# Patient Record
Sex: Female | Born: 1935 | Race: White | Hispanic: No | Marital: Married
Health system: Southern US, Community
[De-identification: ages and names within clinical notes are randomized; demographics above are authoritative.]

---

## 2004-09-26 ENCOUNTER — Ambulatory Visit: Payer: Self-pay | Admitting: Unknown Physician Specialty

## 2004-09-26 ENCOUNTER — Other Ambulatory Visit: Payer: Self-pay

## 2004-09-29 ENCOUNTER — Ambulatory Visit: Payer: Self-pay | Admitting: Unknown Physician Specialty

## 2005-10-11 ENCOUNTER — Ambulatory Visit: Payer: Self-pay | Admitting: Ophthalmology

## 2005-11-22 ENCOUNTER — Ambulatory Visit: Payer: Self-pay | Admitting: Ophthalmology

## 2006-06-19 ENCOUNTER — Ambulatory Visit: Payer: Self-pay | Admitting: Gastroenterology

## 2006-08-08 ENCOUNTER — Ambulatory Visit: Payer: Self-pay | Admitting: Internal Medicine

## 2006-08-29 ENCOUNTER — Ambulatory Visit: Payer: Self-pay | Admitting: Urology

## 2007-02-12 ENCOUNTER — Ambulatory Visit: Payer: Self-pay | Admitting: Urology

## 2007-10-15 ENCOUNTER — Ambulatory Visit: Payer: Self-pay | Admitting: Urology

## 2008-08-06 ENCOUNTER — Ambulatory Visit: Payer: Self-pay | Admitting: Orthopedic Surgery

## 2008-10-13 ENCOUNTER — Ambulatory Visit: Payer: Self-pay | Admitting: Urology

## 2009-10-04 ENCOUNTER — Ambulatory Visit: Payer: Self-pay | Admitting: General Practice

## 2009-10-18 ENCOUNTER — Inpatient Hospital Stay: Payer: Self-pay | Admitting: General Practice

## 2010-02-01 ENCOUNTER — Ambulatory Visit: Payer: Self-pay

## 2010-02-17 ENCOUNTER — Ambulatory Visit: Payer: Self-pay | Admitting: Neurology

## 2010-03-22 ENCOUNTER — Encounter: Payer: Self-pay | Admitting: Neurology

## 2010-04-07 ENCOUNTER — Encounter: Payer: Self-pay | Admitting: Neurology

## 2011-11-26 ENCOUNTER — Ambulatory Visit: Payer: Self-pay | Admitting: Internal Medicine

## 2012-02-28 ENCOUNTER — Emergency Department: Payer: Self-pay | Admitting: Emergency Medicine

## 2012-08-06 ENCOUNTER — Ambulatory Visit: Payer: Self-pay | Admitting: Internal Medicine

## 2012-08-30 ENCOUNTER — Inpatient Hospital Stay: Payer: Self-pay | Admitting: Internal Medicine

## 2012-08-30 LAB — COMPREHENSIVE METABOLIC PANEL
Alkaline Phosphatase: 89 U/L (ref 50–136)
Anion Gap: 4 — ABNORMAL LOW (ref 7–16)
Bilirubin,Total: 0.5 mg/dL (ref 0.2–1.0)
Calcium, Total: 9.1 mg/dL (ref 8.5–10.1)
Creatinine: 0.38 mg/dL — ABNORMAL LOW (ref 0.60–1.30)
Glucose: 217 mg/dL — ABNORMAL HIGH (ref 65–99)
Osmolality: 279 (ref 275–301)
Potassium: 4.1 mmol/L (ref 3.5–5.1)
SGPT (ALT): 19 U/L (ref 12–78)
Sodium: 137 mmol/L (ref 136–145)

## 2012-08-30 LAB — CK TOTAL AND CKMB (NOT AT ARMC)
CK, Total: 29 U/L (ref 21–215)
CK, Total: 30 U/L (ref 21–215)
CK, Total: 27 U/L (ref 21–215)
CK-MB: 0.8 ng/mL (ref 0.5–3.6)
CK-MB: 1.8 ng/mL (ref 0.5–3.6)

## 2012-08-30 LAB — CBC
HCT: 39.7 % (ref 35.0–47.0)
MCH: 31.9 pg (ref 26.0–34.0)
MCHC: 33.5 g/dL (ref 32.0–36.0)
MCV: 95 fL (ref 80–100)
Platelet: 296 10*3/uL (ref 150–440)
RBC: 4.17 10*6/uL (ref 3.80–5.20)
RDW: 12.8 % (ref 11.5–14.5)

## 2012-08-30 LAB — TROPONIN I
Troponin-I: <0.02 ng/mL
Troponin-I: <0.02 ng/mL

## 2012-08-31 LAB — BASIC METABOLIC PANEL
BUN: 6 mg/dL — ABNORMAL LOW (ref 7–18)
Calcium, Total: 8.9 mg/dL (ref 8.5–10.1)
Chloride: 106 mmol/L (ref 98–107)
EGFR (African American): >60
EGFR (Non-African Amer.): >60
Glucose: 89 mg/dL (ref 65–99)
Osmolality: 282 (ref 275–301)
Potassium: 3.6 mmol/L (ref 3.5–5.1)
Sodium: 143 mmol/L (ref 136–145)

## 2012-08-31 LAB — CBC WITH DIFFERENTIAL/PLATELET
Basophil #: 0 10*3/uL (ref 0.0–0.1)
Eosinophil %: 0 %
HGB: 11.1 g/dL — ABNORMAL LOW (ref 12.0–16.0)
Lymphocyte %: 10.5 %
Monocyte #: 1.6 x10 3/mm — ABNORMAL HIGH (ref 0.2–0.9)
Monocyte %: 11.9 %
Neutrophil %: 77.4 %
Platelet: 237 10*3/uL (ref 150–440)
RDW: 12.8 % (ref 11.5–14.5)
WBC: 13.4 10*3/uL — ABNORMAL HIGH (ref 3.6–11.0)

## 2012-08-31 LAB — TSH: Thyroid Stimulating Horm: 0.783 u[IU]/mL

## 2012-09-02 LAB — CBC WITH DIFFERENTIAL/PLATELET
Basophil #: 0 10*3/uL (ref 0.0–0.1)
Eosinophil #: 0 10*3/uL (ref 0.0–0.7)
Eosinophil %: 0 %
HGB: 10.1 g/dL — ABNORMAL LOW (ref 12.0–16.0)
Lymphocyte #: 0.7 10*3/uL — ABNORMAL LOW (ref 1.0–3.6)
Lymphocyte %: 9.1 %
MCH: 32.2 pg (ref 26.0–34.0)
MCHC: 34 g/dL (ref 32.0–36.0)
MCV: 95 fL (ref 80–100)
Monocyte %: 8.8 %
Neutrophil #: 6.2 10*3/uL (ref 1.4–6.5)
Neutrophil %: 82 %
Platelet: 263 10*3/uL (ref 150–440)
RBC: 3.14 10*6/uL — ABNORMAL LOW (ref 3.80–5.20)
RDW: 12.5 % (ref 11.5–14.5)
WBC: 7.6 10*3/uL (ref 3.6–11.0)

## 2012-09-04 LAB — CULTURE, BLOOD (SINGLE)

## 2012-09-06 ENCOUNTER — Ambulatory Visit: Payer: Self-pay | Admitting: Internal Medicine

## 2012-11-06 DEATH — deceased

## 2014-05-29 NOTE — H&P (Signed)
PATIENT NAME:  Melanie Dawson, Melanie Dawson MR#:  [id number] DATE OF BIRTH:  Feb 26, 1934  DATE OF ADMISSION:  08/30/2012  PRIMARY CARE PHYSICIAN:  Dr. Sherrie MustacheFayegh Jadali.   REFERRING PHYSICIAN:  Dr. Dolores FrameSung.   CHIEF COMPLAINT:  Respiratory distress.   HISTORY OF PRESENT ILLNESS:  Melanie Dawson is Dawson 80 year old with amyotrophic lateral sclerosis, severe dysarthria, severe protein calorie malnutrition, is brought to the Emergency Department by EMS for severe respiratory distress.  Per husband, the patient has not been feeling well for the last 3 to 4 days.  This evening noted to have severe respiratory distress.  Concerning this, EMS was called.  The patient is found to have right lower lobe pneumonia.  The patient cannot provide any history secondary to severe dysphonia.  However, the patient's husband also is Dawson poor historian.  Per husband, the patient does not have any cough.  The patient denies any chest pain.  The patient is also found to have elevated white blood cell count.  The patient received vancomycin and Zosyn in the Emergency Department.  ABG showed pH of 7.17, pCO2 of 89, PaO2 of 78.  The patient is placed on BiPAP.  The patient's initial blood pressure was well within normal limits.  However, after placing the BiPAP, patient's blood pressure trended down to systolic blood pressure at 70s.  The patient was given 1 liter fluid bolus with significant improvement with the blood pressure to 110 to 120.  The patient continues to be tachycardic in the Emergency Department.   PAST MEDICAL HISTORY: 1.  Amyotrophic lateral sclerosis.  2.  Gastroesophageal reflux disease.  3.  History of MRSA on nasal swab.  4.  History of asthma.   PAST SURGICAL HISTORY: 1.  Right total knee replacement.  2.  Partial hysterectomy.  3.  Bladder surgery. 4.  Back surgery.   ALLERGIES:   1.  CODEINE CAUSED GI DISTRESS.   2.  CLARITHROMYCIN.  3.  NOVAHISTINE.  4.  KEFLEX.  5.  MINOCIN.  6.  . 7.  NAPROSYN.  ALL THESE  CAUSED GI DISTRESS.   HOME MEDICATIONS: 1.   60 mg once Dawson day.  2.  Premarin 3 mg once Dawson day.  3.  Oxycodone 5 mg 1 to 2 tablets every 4 hours as needed.  4.  Omeprazole 20 mg once Dawson day.  5.  Niacin 1 tablet once Dawson day.  6.  Lovenox 40 mg subcutaneous once Dawson day.  7.  Flovent 1 puff inhalation in the morning and at bedtime.  8.  Flonase two sprays to each nostril.  9.  Doxepin 75 mg once Dawson day.  10.  Claritin 10 mg daily.  11.  Multivitamin 1 tablet daily.  12.  Calcium with vitamin D 1 capsule 2 times Dawson day.  13.  Aspirin 81 mg daily. 14.  Albuterol inhaler once Dawson day.  15.  Actonel 35 mg once Dawson week.   SOCIAL HISTORY:  No history of smoking, drinking alcohol or using illicit drugs.  Married, lives with her husband.  The patient is mostly bedbound, however the patient's husband takes her in Dawson wheelchair.   REVIEW OF SYSTEMS:  Could not be obtained from the patient as the patient has difficulty speaking.   PHYSICAL EXAMINATION: GENERAL:  This is Dawson cachectic-looking female lying down in the bed on BiPAP not in distress.  VITAL SIGNS:  Temperature 97.5, pulse 127, blood pressure 116/64, respiratory rate of 40, oxygen saturation is 97% on BiPAP.  HEENT:  Head normocephalic, atraumatic.  Eyes, no sclerae icterus.  Conjunctivae normal.  Pupils equal and react to light.  Extraocular movements are intact.  Mucous membranes, mild dryness.  No pharyngeal erythema.  NECK:  Supple.  No lymphadenopathy.  No JVD.  No carotid bruit.  No thyromegaly.  CHEST:  Has no focal tenderness.  Poor air entry in some areas.  Could not appreciate if the patient has any crackles.  HEART:  S1 and S2, regular, tachycardia.  No pedal edema.  Pulses 2+.  ABDOMEN:  Bowel sounds plus.  Soft, nontender, nondistended.  NEUROLOGIC:  The patient is alert, oriented to self and husband.  The patient is able to follow the commands well and appropriately.  Secondary to the patient's severe dysphonia, unable to evaluate the  patient's orientation to place and time.  No cranial nerve abnormalities.  Moving all four extremities.  I could not examine the motor strength.  SKIN:  No rash or lesions.  MUSCULOSKELETAL:  Has good range of motion.   LABORATORY DATA:  CBC, WBC of 7.8, hemoglobin 13, platelet count of 296.  ABG, pH of 7.17, pCO2 of 89, PaO2 of 78.   BMP is completely within normal limits.   ASSESSMENT AND PLAN:  Melanie Dawson is Dawson 80 year old female who comes to the Emergency Department with severe respiratory distress secondary to pneumonia.  1.  Pneumonia.  This is aspiration versus healthcare-associated pneumonia.  Keep the patient on vancomycin and Zosyn.  Blood cultures have been obtained in the Emergency Department.  We will keep the patient nothing by mouth.  We will obtain Dawson swallow evaluation in the morning.  Even though the patient was able to eat, considering patient's progressive disease of amyotrophic lateral sclerosis, tend to develop aspiration pneumonia.  2.  Respiratory failure, hypoxic, hypercarbic, acute.  Continue with BiPAP.  Discussed with the patient, the patient wants to be intubated at this time.  However, we need to discuss further especially concerning about the patient's amyotrophic lateral sclerosis.  3.  Amyotrophic lateral sclerosis, expected to continue to progress.  4.  Severe protein calorie malnutrition.  Per husband, the patient eats by mouth.   5.  Keep the patient on DVT prophylaxis with Lovenox.    TIME SPENT:  50 minutes.    ____________________________ Susa Griffins, MD pv:ea D: 08/30/2012 02:58:20 ET T: 08/30/2012 04:43:17 ET JOB#: [id number]  cc: Susa Griffins, MD, <Dictator> Marlyn Corporal, MD Susa Griffins MD ELECTRONICALLY SIGNED 09/15/2012 21:40

## 2014-05-29 NOTE — Discharge Summary (Signed)
PATIENT NAME:  Melanie Dawson, Melanie Dawson MR#:  [id number] DATE OF BIRTH:  November 13, 1934  DATE OF ADMISSION:  08/30/2012 DATE OF DISCHARGE:  09/03/2012  DISCHARGE DIAGNOSES:  1.  End-stage amyotrophic lateral sclerosis.  2.  Severe protein calorie malnutrition.  3.  Acute respiratory failure due to bilateral pneumonia with partial collapse of lung.  4.  Tachycardia, on metoprolol.  5.  Bilateral pneumonia.   SECONDARY DIAGNOSES:  1.  Amyotrophic lateral sclerosis, end-stage.  2.  Gastroesophageal reflux disease.  3.  History of methicillin-resistant Staphylococcus aureus.  4.  History of asthma.   CONSULTATIONS:  1.  Pulmonary: Dr. Belia Heman.  2.  Palliative care.  3.  Speech therapy.   PROCEDURES AND RADIOLOGY:  1.  Chest x-ray on July 25 showed bilateral diffuse interstitial thickening representing chronic interstitial disease superimposed with mild interstitial edema  versus interstitial pneumonitis secondary to infectious or inflammatory etiology.  2.  Chest x-ray on July 26 showed left lung base infiltrate. Chronic scar versus small effusion. No focal consolidation. Possible pulmonary fibrosis or congestion versus mild edema cannot be excluded.  3.  CT scan of chest with contrast on July 26 showed no PE. Adrenal adenoma on the right. Findings consistent with an infrahilar mass. These are consistent with near complete occlusion secondary to mass effect versus possible invasion of the left lower lobe bronchus. Narrowing of the right lower lobe bronchus was also identified. Complete collapse with possible consolidated component in the left lower lobe with area of collapse and/or right lower lobe consolidated infiltrate.  4.  Possible gastric wall thickening with collapse of the stomach also not excluded.   MAJOR LABORATORY PANEL: Blood cultures x2 were negative on July 25.   INTERIM HISTORY AND SHORT HOSPITAL COURSE: The patient is a 80 year old female, with the above-mentioned medical problems,  who was admitted for acute respiratory failure thought to be secondary to pneumonia. The patient was evaluated by Dr. Belia Heman who recommended CT scan of chest, which was performed with results dictated above. The patient was felt to be a very poor candidate for any kind of invasive procedure to evaluate her mass in the lung with partial collapse. She also had end-stage amyotrophic lateral sclerosis with severe chronic protein calorie malnutrition for which palliative care consultation was obtained, who had a discussion with family, and patient including family were all in agreement for hospice services, and the patient was deemed appropriate for hospice home, and is being discharged there as a bed was available today.  DISCHARGE PERTINENT PHYSICAL EXAMINATION:  VITAL SIGNS: Temperature 98.7, heart rate 115 per minute, respirations 20 per minute, blood pressure 123/66 mmHg . She was saturating at 98% on 4 liters oxygen via nasal cannula.  CARDIOVASCULAR: Tachycardic. No murmurs, rubs or gallop.  ABDOMEN: Soft, nontender, nondistended. Bowel sounds present. No organomegaly or masses.  LUNGS: Coarse anterior field. Shallow breathing with poor air movement. Very weak cough and unable to expectorate.  GENERAL: Very cachectic and chronically ill appearing.  NEUROLOGIC: Limited range of motion with dysphonia. She was following all commands and was alert and oriented.   Other physical examination remained at baseline.   DISCHARGE MEDICATIONS:  1.  Lorazepam 0.5 mg p.o. every 2 to 4 hours as needed.  2. Morphine 20 mg/mL, 5 mL p.o. every 1 to 2 hours as needed.  3.  Oxycodone 20 mg/mL 5 mL p.o. every 1 to two hours as needed.  4.  Phenobarbital suppository 200 mg per rectum once a day as needed.  5.  Zofran 4 mg p.o. every 6 hours as needed.   DISCHARGE DIET: As tolerated.   DISCHARGE ACTIVITY: As tolerated.   DISCHARGE INSTRUCTIONS AND FOLLOW-UP: The patient's medication may need to be crushed or use  liquid when appropriate. May change to rectal route if unable to swallow. Oxygen to be continued at 2 to 4 liters via nasal cannula continuous and as needed. Foley catheter to be left indwelling for prevention of skin breakdown and/or urinary incontinence.   CODE STATUS: DNR.   Total time discharging this patient was 55 minutes.   PROGNOSIS: Extremely poor prognosis and will likely die in near future.    ____________________________ Ellamae SiaVipul S. Sherryll BurgerShah, MD vss:np D: 09/03/2012 16:53:19 ET T: 09/03/2012 17:50:55 ET JOB#: [id number]  cc: Ved Martos S. Sherryll BurgerShah, MD, <Dictator> Marlyn CorporalFayegh H. Jadali, MD Ned GraceNancy Phifer, MD Dory LarsenKurian D. Kasa, MD   Ellamae SiaVIPUL S [facility] StrattonHAH MD ELECTRONICALLY SIGNED 09/05/2012 9:18

## 2015-02-22 IMAGING — CT CT CHEST W/ CM
1 series · 14 of 32 positions shown, 18 images · non-contrast
Comparison: none

REASON FOR EXAM: shortness of breath and tachycardia
COMMENTS:

PROCEDURE:     CT  - CT CHEST (FOR PE) W  - August 31, 2012 [DATE]
RESULT:
TECHNIQUE: Helical 3 mm sections were obtained from the thoracic inlet
through the lung bases status post intravenous administration of 75 mL of
[id number].

[Series 4: soft tissue · axial · 0.62mm/px · z∈[-234,+0]mm · 14 of 87 slices shown, 18 images]
[im 6/87  soft-tissue]
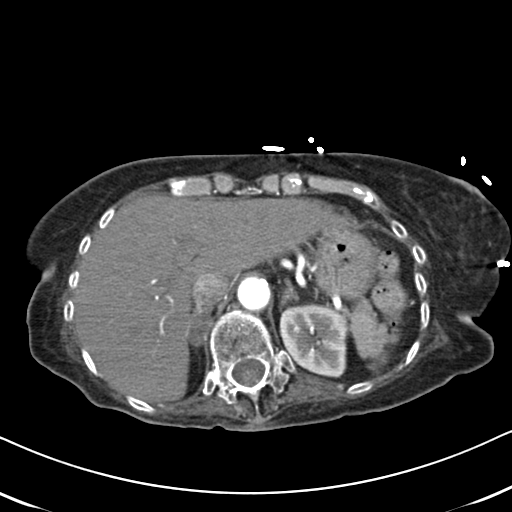
[im 6/87  bone]
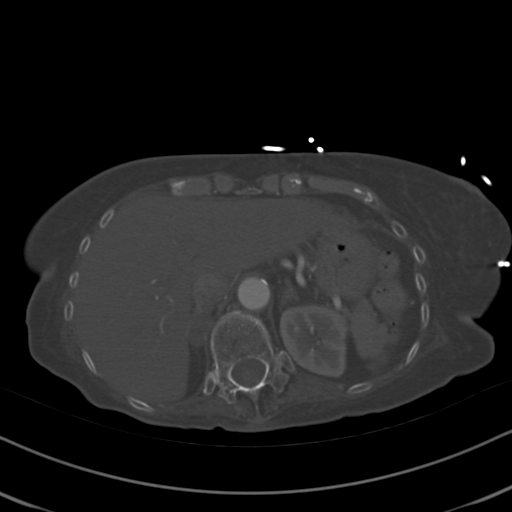
[im 12/87  soft-tissue]
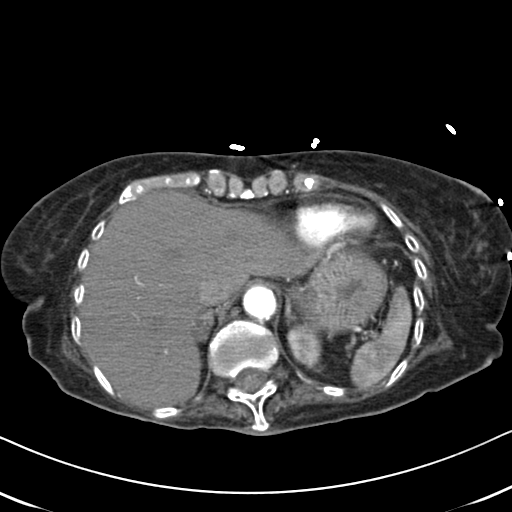
[im 20/87  soft-tissue]
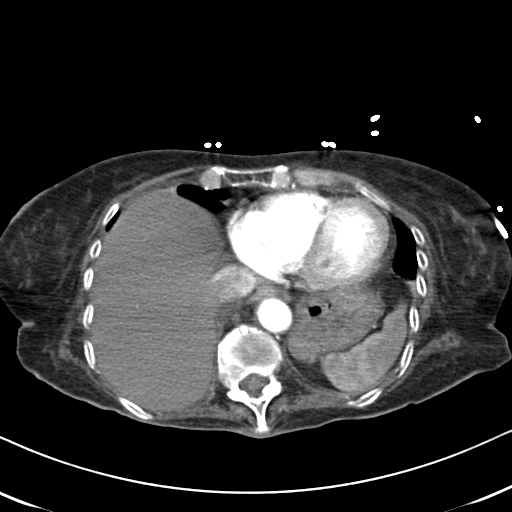
[im 25/87  soft-tissue]
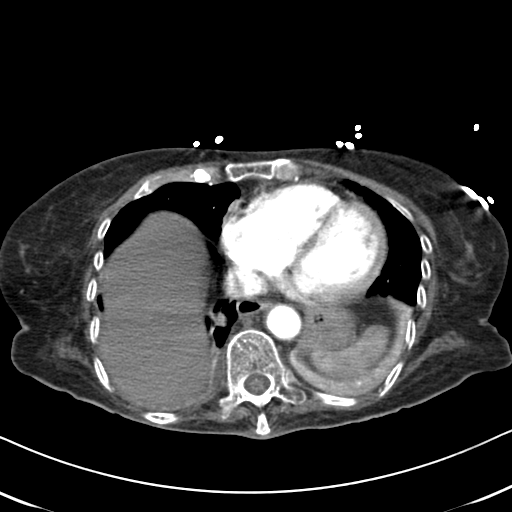
[im 34/87  soft-tissue]
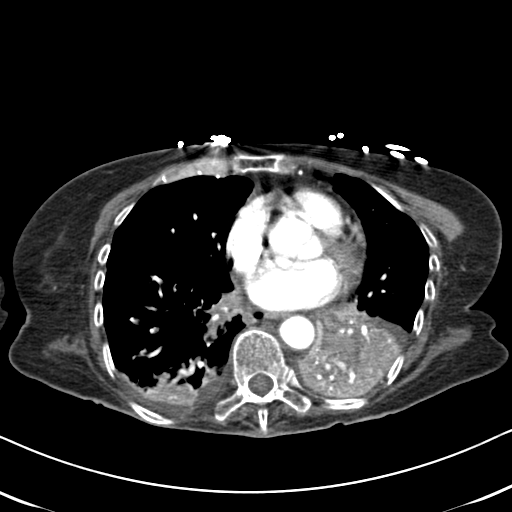
[im 39/87  soft-tissue]
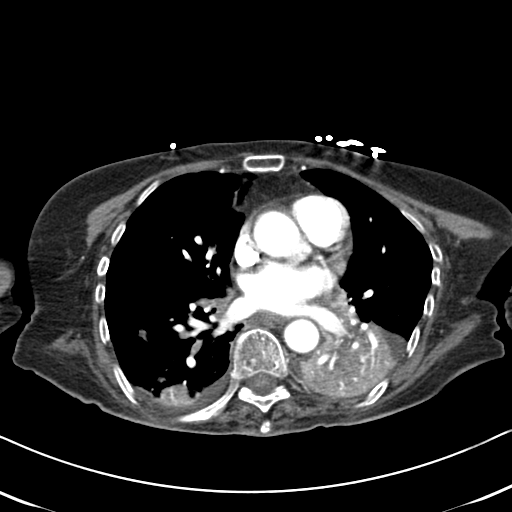
[im 48/87  soft-tissue]
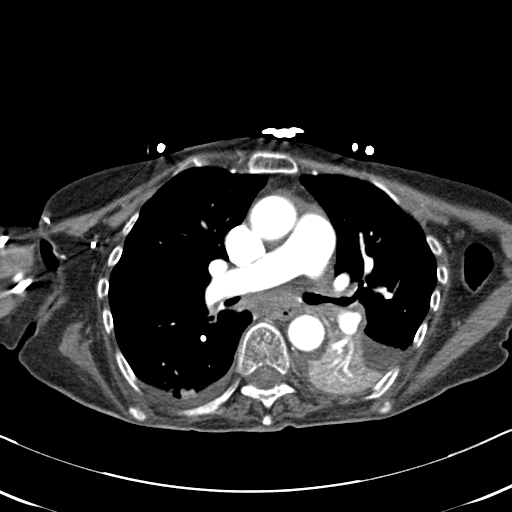
[im 53/87  soft-tissue]
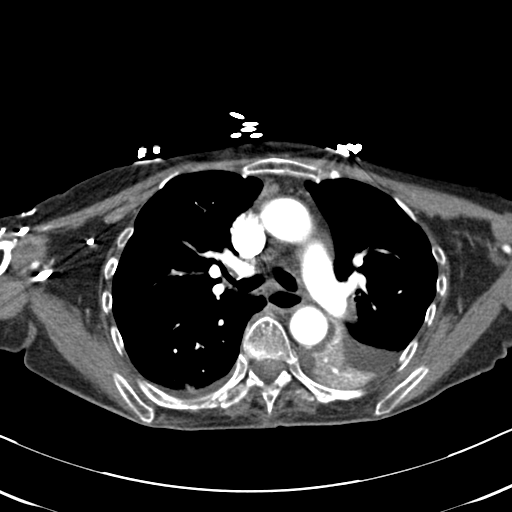
[im 62/87  soft-tissue]
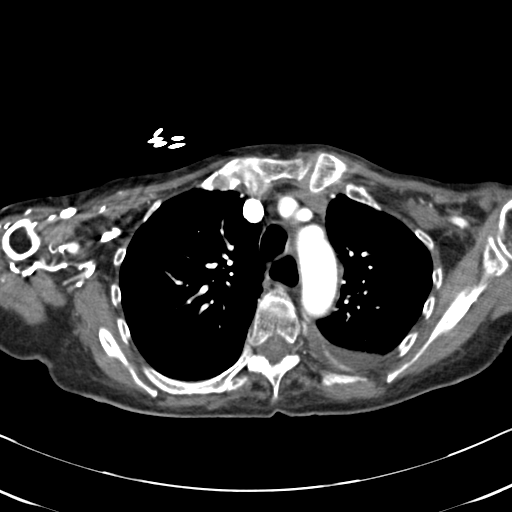
[im 62/87  bone]
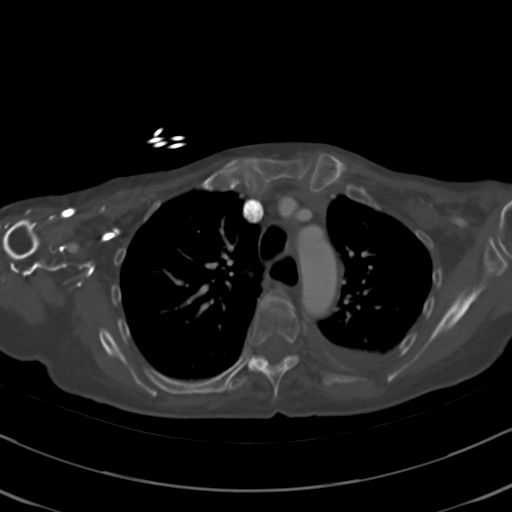
[im 67/87  soft-tissue]
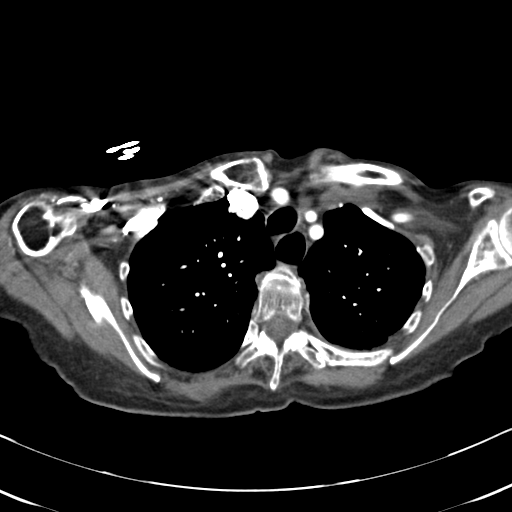
[im 75/87  soft-tissue]
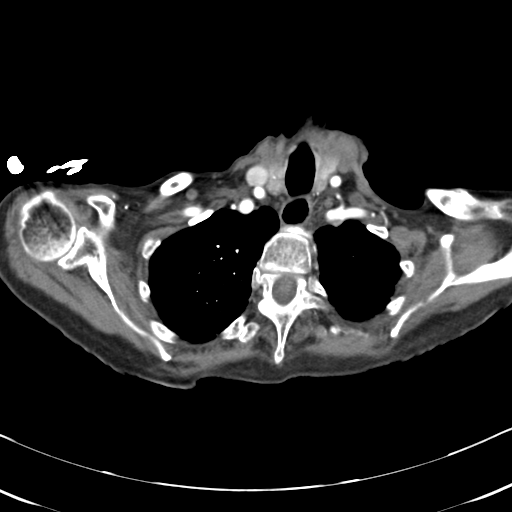
[im 75/87  lung]
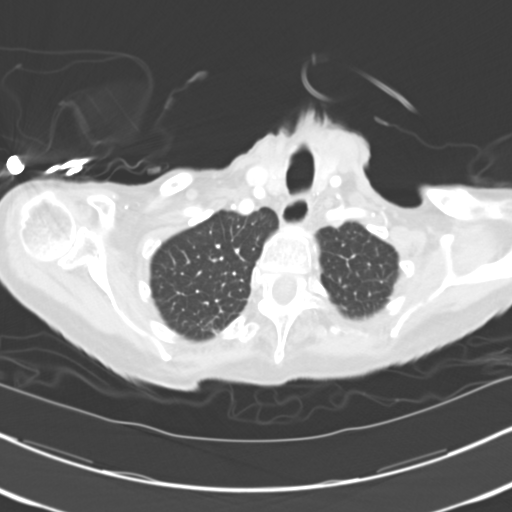
[im 78/87  lung]
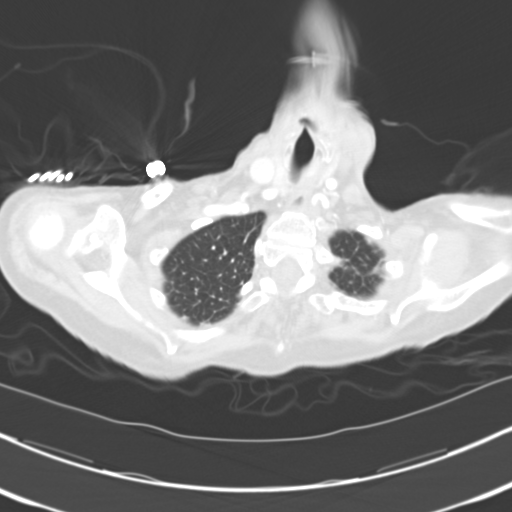
[im 81/87  soft-tissue]
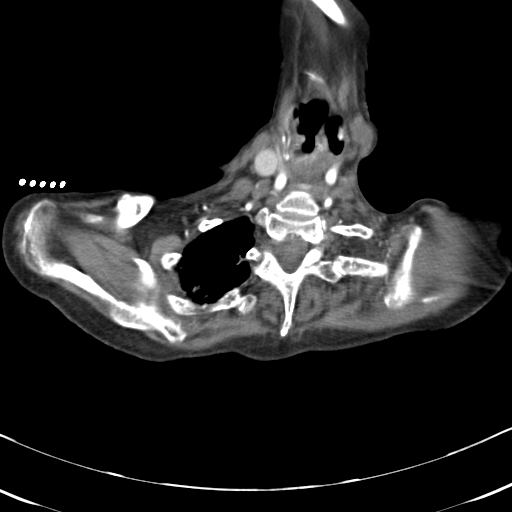
[im 81/87  lung]
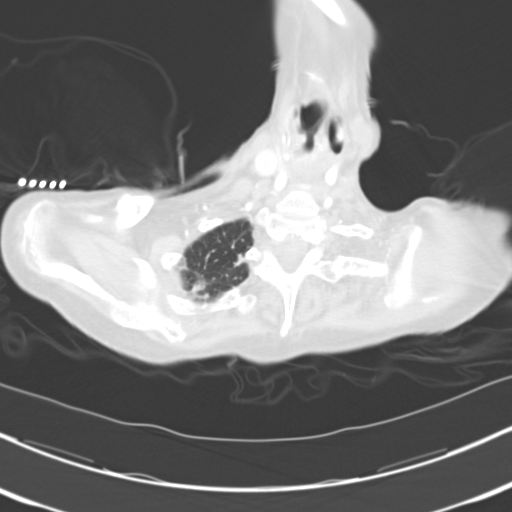
[im 84/87  lung]
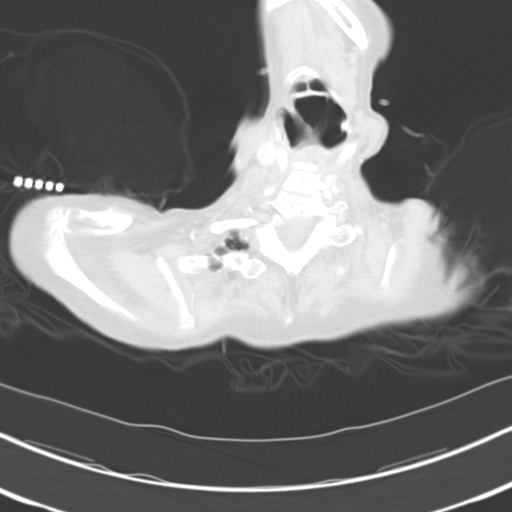

[14 of 32 positions shown; findings below may reference images not displayed]

FINDINGS: Evaluation of the mediastinum and hilar regions and structures
demonstrates increased soft tissue density within the subcarinal region.
This extends into the left hilar region. There is narrowing of the distal
aspect of the left mainstem bronchus and mild narrowing of the proximal
aspect of the left upper lobe bronchus. There is high-grade stenosis of the
proximal aspect of the left lower lobe bronchus with what appears to be near
complete occlusion distally. There are findings consistent with collapse of
the left lower lobe. A component of consolidative lung is also of diagnostic
consideration. The previously described soft tissue density also extends
into the right hilar region. There are areas of narrowing of the bronchus
intermedius distally extending into the proximal aspect of the right lower
lobe bronchus. Areas of subsegmental bronchostenosis are appreciated. There
are areas of consolidative lung appreciated within the right lower lobe.
Nodular densities are also identified.

There is no evidence of filling defects within the main, lobar, or segmental
pulmonary arteries.

The lung parenchyma demonstrates areas of interlobular septal thickening
within the lung bases. A very small/trace left effusion is appreciated.

The visualized upper abdominal viscera demonstrate a 2.74 cm right adrenal
mass with Hounsfield units of 7 consistent with an adrenal adenoma. The
remaining visualized upper abdominal viscera demonstrate no further gross
abnormalities.
IMPRESSION: 1. Findings consistent with an infrahilar mass as described above.
Differential considerations are soft tissue mass though an etiology such as
infection cannot be excluded. There are findings consistent with near
complete occlusion secondary to mass effect or possible invasion of the left
lower lobe bronchus. Narrowing of the right lower lobe bronchus is also
identified. There is complete collapse with possibly consolidative component
of the left lower lobe with areas of collapse and/or consolidative
infiltration in the right lower lobe. Pulmonology consultation recommended.
2. No CT evidence of pulmonary arterial embolic disease.
3. Findings consistent with adrenal adenoma on the right.
4. Not mentioned above, limited evaluation of the gastric fundus and cardia
regions demonstrate either collapse of the stomach or possible gastric wall
thickening, clinical correlation recommended.
